# Patient Record
Sex: Male | Born: 1985 | Race: Black or African American | Hispanic: No | Marital: Married
Health system: Southern US, Community
[De-identification: ages and names within clinical notes are randomized; demographics above are authoritative.]

---

## 2018-02-03 DIAGNOSIS — F418 Other specified anxiety disorders: Secondary | ICD-10-CM | POA: Diagnosis not present

## 2018-02-28 DIAGNOSIS — H0012 Chalazion right lower eyelid: Secondary | ICD-10-CM | POA: Diagnosis not present

## 2019-07-04 ENCOUNTER — Emergency Department (HOSPITAL_COMMUNITY)
Admission: EM | Admit: 2019-07-04 | Discharge: 2019-07-04 | Disposition: A | Discharge status: Home / Self Care | Payer: BC Managed Care – PPO | Attending: Emergency Medicine | Admitting: Emergency Medicine

## 2019-07-04 ENCOUNTER — Emergency Department (HOSPITAL_COMMUNITY): Payer: BC Managed Care – PPO

## 2019-07-04 ENCOUNTER — Other Ambulatory Visit: Payer: Self-pay

## 2019-07-04 DIAGNOSIS — R197 Diarrhea, unspecified: Secondary | ICD-10-CM | POA: Insufficient documentation

## 2019-07-04 DIAGNOSIS — B349 Viral infection, unspecified: Secondary | ICD-10-CM | POA: Insufficient documentation

## 2019-07-04 DIAGNOSIS — Z20828 Contact with and (suspected) exposure to other viral communicable diseases: Secondary | ICD-10-CM | POA: Insufficient documentation

## 2019-07-04 DIAGNOSIS — R509 Fever, unspecified: Secondary | ICD-10-CM | POA: Diagnosis not present

## 2019-07-04 LAB — CBC WITH DIFFERENTIAL/PLATELET
Abs Immature Granulocytes: 0.01 10*3/uL (ref 0.00–0.07)
Basophils Absolute: 0 10*3/uL (ref 0.0–0.1)
Basophils Relative: 1 %
Eosinophils Absolute: 0 10*3/uL (ref 0.0–0.5)
Eosinophils Relative: 0 %
HCT: 46.4 % (ref 39.0–52.0)
Hemoglobin: 15.7 g/dL (ref 13.0–17.0)
Immature Granulocytes: 0 %
Lymphocytes Relative: 12 %
Lymphs Abs: 1 10*3/uL (ref 0.7–4.0)
MCH: 27 pg (ref 26.0–34.0)
MCHC: 33.8 g/dL (ref 30.0–36.0)
MCV: 79.9 fL — ABNORMAL LOW (ref 80.0–100.0)
Monocytes Absolute: 0.7 10*3/uL (ref 0.1–1.0)
Monocytes Relative: 8 %
Neutro Abs: 6.6 10*3/uL (ref 1.7–7.7)
Neutrophils Relative %: 79 %
Platelets: 161 10*3/uL (ref 150–400)
RBC: 5.81 MIL/uL (ref 4.22–5.81)
RDW: 13 % (ref 11.5–15.5)
WBC: 8.3 10*3/uL (ref 4.0–10.5)
nRBC: 0 % (ref 0.0–0.2)

## 2019-07-04 LAB — COMPREHENSIVE METABOLIC PANEL
ALT: 41 U/L (ref 0–44)
AST: 27 U/L (ref 15–41)
Albumin: 4.3 g/dL (ref 3.5–5.0)
Alkaline Phosphatase: 61 U/L (ref 38–126)
Anion gap: 6 (ref 5–15)
BUN: 12 mg/dL (ref 6–20)
CO2: 27 mmol/L (ref 22–32)
Calcium: 9.3 mg/dL (ref 8.9–10.3)
Chloride: 106 mmol/L (ref 98–111)
Creatinine, Ser: 1.28 mg/dL — ABNORMAL HIGH (ref 0.61–1.24)
GFR calc Af Amer: >60 mL/min (ref >60)
GFR calc non Af Amer: >60 mL/min (ref >60)
Glucose, Bld: 79 mg/dL (ref 70–99)
Potassium: 4.4 mmol/L (ref 3.5–5.1)
Sodium: 139 mmol/L (ref 135–145)
Total Bilirubin: 0.4 mg/dL (ref 0.3–1.2)
Total Protein: 7.6 g/dL (ref 6.5–8.1)

## 2019-07-04 MED ORDER — SODIUM CHLORIDE 0.9 % IV BOLUS: 1000.0000 mL | Freq: Once | INTRAVENOUS | Status: DC

## 2019-07-04 MED ORDER — KETOROLAC TROMETHAMINE 30 MG/ML IJ SOLN
30.0000 mg | Freq: Once | INTRAMUSCULAR | Status: DC
  Filled 2019-07-04: qty 1

## 2019-07-04 MED ORDER — ONDANSETRON HCL 4 MG/2ML IJ SOLN
4.0000 mg | Freq: Once | INTRAMUSCULAR | Status: DC
  Filled 2019-07-04: qty 2

## 2019-07-04 NOTE — ED Provider Notes (Signed)
[facility]-EMERGENCY DEPT Provider Note   CSN: [id number] Arrival date & time: 07/04/19  1113     History   Chief Complaint Chief Complaint  Patient presents with  . Fever  . Diarrhea    HPI Steven Blackburn is a 33 y.o. male who presents for evaluation of generalized body aches, low-grade fever, diarrhea, headache that began yesterday.  He started noticing at home, he had a fever of 100.8.  He states he has been taking Tylenol and ibuprofen.  He states that last night and this morning, he had several episodes of nonbloody diarrhea.  He states he is just felt achy and sore.  He reports that today, he started developing a mild headache that gradually worsened.  He also reports a cough that is productive of green sputum.  No blood.  He states he is not having any difficulty breathing or chest pain.  Patient states he has not had any recent travel.  He does report that he goes to [employer] and recently was at a car dealership but states he was wearing mask the entire time.  He does not have any known COVID-19 exposure.  He denies any vision changes, numbness/weakness of his arms or legs, nausea/vomiting.     The history is provided by the patient.    No past medical history on file.  There are no active problems to display for this patient.    The histories are not reviewed yet. Please review them in the "History" navigator section and refresh this SmartLink.      Home Medications    Prior to Admission medications   Medication Sig Start Date End Date Taking? Authorizing Provider  acetaminophen (TYLENOL) 325 MG tablet Take 650 mg by mouth every 6 (six) hours as needed for mild pain, fever or headache.   Yes [provider]    Family History No family history on file.  Social History Social History   Tobacco Use  . Smoking status: Not on file  Substance Use Topics  . Alcohol use: Not on file  . Drug use: Not on file     Allergies   Patient  has no known allergies.   Review of Systems Review of Systems  Constitutional: Positive for fever.  Respiratory: Negative for cough and shortness of breath.   Cardiovascular: Negative for chest pain.  Gastrointestinal: Positive for diarrhea. Negative for abdominal pain, nausea and vomiting.  Genitourinary: Negative for dysuria and hematuria.  Musculoskeletal: Positive for myalgias.  Neurological: Positive for headaches.  All other systems reviewed and are negative.    Physical Exam Updated Vital Signs BP 117/74   Pulse 85   Temp 98.8 F (37.1 C)   Resp 18   SpO2 100%   Physical Exam Vitals signs and nursing note reviewed.  Constitutional:      Appearance: Normal appearance. He is well-developed.  HENT:     Head: Normocephalic and atraumatic.  Eyes:     General: Lids are normal.     Conjunctiva/sclera: Conjunctivae normal.     Pupils: Pupils are equal, round, and reactive to light.     Comments: PERRL. EOMs intact. No nystagmus. No neglect.   Neck:     Musculoskeletal: Full passive range of motion without pain and neck supple.     Comments: Neck is supple and without rigidity. Cardiovascular:     Rate and Rhythm: Normal rate and regular rhythm.     Pulses: Normal pulses.     Heart sounds:  Normal heart sounds. No murmur. No friction rub. No gallop.   Pulmonary:     Effort: Pulmonary effort is normal.     Breath sounds: Normal breath sounds.     Comments: Lungs clear to auscultation bilaterally.  Symmetric chest rise.  No wheezing, rales, rhonchi. Abdominal:     Palpations: Abdomen is soft. Abdomen is not rigid.     Tenderness: There is no abdominal tenderness. There is no guarding.     Comments: Abdomen is soft, non-distended, non-tender. No rigidity, No guarding. No peritoneal signs.  Musculoskeletal: Normal range of motion.  Skin:    General: Skin is warm and dry.     Capillary Refill: Capillary refill takes less than 2 seconds.  Neurological:     Mental  Status: He is alert and oriented to person, place, and time.     Comments: Follows commands, Moves all extremities  5/5 strength to BUE and BLE  Sensation intact throughout all major nerve distributions  Psychiatric:        Speech: Speech normal.      ED Treatments / Results  Labs (all labs ordered are listed, but only abnormal results are displayed) Labs Reviewed  COMPREHENSIVE METABOLIC PANEL - Abnormal; Notable for the following components:      Result Value   Creatinine, Ser 1.28 (*)    All other components within normal limits  CBC WITH DIFFERENTIAL/PLATELET - Abnormal; Notable for the following components:   MCV 79.9 (*)    All other components within normal limits  NOVEL CORONAVIRUS, NAA (HOSP ORDER, SEND-OUT TO REF LAB; TAT 18-24 HRS)    EKG None  Radiology Dg Chest Portable 1 View  Result Date: 07/04/2019 CLINICAL DATA:  Pt states that he has had fever 100.8 at home, along with diarrhea. Pt states that he has had a headache and lightheadedness as well. Pt states some relief with tylenol. EXAM: PORTABLE CHEST 1 VIEW COMPARISON:  None. FINDINGS: The heart size and mediastinal contours are within normal limits. Both lungs are clear. No pleural effusion or pneumothorax. The visualized skeletal structures are unremarkable. IMPRESSION: Normal frontal chest radiograph. Electronically Signed   By: Lajean Manes M.D.   On: 07/04/2019 13:12    Procedures Procedures (including critical care time)  Medications Ordered in ED Medications - No data to display   Initial Impression / Assessment and Plan / ED Course  I have reviewed the triage vital signs and the nursing notes.  Pertinent labs & imaging results that were available during my care of the patient were reviewed by me and considered in my medical decision making (see chart for details).        33 year old male who presents for evaluation of generalized body aches, diarrhea, low-grade fever, cough that began  yesterday.  No recent travel.  No known COVID-19 exposure.  Initially arrival, he is afebrile, nontoxic-appearing.  Vitals appear stable.  Concern for dehydration versus viral infectious process.  History/physical exam is not concerning for meningitis.  Additionally, do not suspect intracranial abnormality.  Suspect this is consolation of symptoms from viral process.  Additionally, his abdomen exam is benign.  Do not feel is a surgical abdomen.  Indication for CT abdomen pelvis.  Plan for labs, chest x-ray.  CBC shows no acute leukocytosis or anemia.  CMP is unremarkable.  Discussed results with patient.  Patient is sitting comfortably in bed.  Vitals are stable.  Discussed with patient that he has a Covid test pending.  At this time, patient  stable for discharge. At this time, patient exhibits no emergent life-threatening condition that require further evaluation in ED or admission. Patient had ample opportunity for questions and discussion. All patient's questions were answered with full understanding. Strict return precautions discussed. Patient expresses understanding and agreement to plan.   Louis Ivery was evaluated in Emergency Department on 07/04/2019 for the symptoms described in the history of present illness. He was evaluated in the context of the global COVID-19 pandemic, which necessitated consideration that the patient might be at risk for infection with the SARS-CoV-2 virus that causes COVID-19. Institutional protocols and algorithms that pertain to the evaluation of patients at risk for COVID-19 are in a state of rapid change based on information released by regulatory bodies including the CDC and federal and state organizations. These policies and algorithms were followed during the patient's care in the ED.  Portions of this note were generated with Scientist, clinical (histocompatibility and immunogenetics). Dictation errors may occur despite best attempts at proofreading.      Final Clinical Impressions(s) / ED  Diagnoses   Final diagnoses:  Viral illness  Diarrhea, unspecified type    ED Discharge Orders    None       Maxwell Caul, PA-C 07/04/19 1737    Pricilla Loveless, MD 07/05/19 1020

## 2019-07-04 NOTE — ED Triage Notes (Addendum)
Pt states that he has had fever 100.8 at home, along with diarrhea. Pt states that he has had a headache as well. Pt states some relief with tylenol. Pt states some lightheadedness as well.

## 2019-07-04 NOTE — Discharge Instructions (Addendum)
You have been tested today for COVID-19.  Results will take about 24 hours to come back.  You can follow-up online regarding the status of your tests.  You should quarantine until results come back.  Make sure you are staying hydrated and drinking plenty of fluids.  You can take Tylenol or Ibuprofen as directed for pain. You can alternate Tylenol and Ibuprofen every 4 hours. If you take Tylenol at 1pm, then you can take Ibuprofen at 5pm. Then you can take Tylenol again at 9pm.   Return the emergency department for any trouble breathing, inability eat or drink or any other worsening or concerning symptoms.     Person Under Monitoring Name: Steven Blackburn  [place] [id number] [place] [id number]   Infection Prevention Recommendations for Individuals Confirmed to have, or Being Evaluated for, 2019 Novel Coronavirus (COVID-19) Infection Who Receive Care at Home  Individuals who are confirmed to have, or are being evaluated for, COVID-19 should follow the prevention steps below until a healthcare provider or local or state health department says they can return to normal activities.  Stay home except to get medical care You should restrict activities outside your home, except for getting medical care. Do not go to work, school, or public areas, and do not use public transportation or taxis.  Call ahead before visiting your doctor Before your medical appointment, call the healthcare provider and tell them that you have, or are being evaluated for, COVID-19 infection. This will help the healthcare providers office take steps to keep other people from getting infected. Ask your healthcare provider to call the local or state health department.  Monitor your symptoms Seek prompt medical attention if your illness is worsening (e.g., difficulty breathing). Before going to your medical appointment, call the healthcare provider and tell them that you have, or are being evaluated for,  COVID-19 infection. Ask your healthcare provider to call the local or state health department.  Wear a facemask You should wear a facemask that covers your nose and mouth when you are in the same room with other people and when you visit a healthcare provider. People who live with or visit you should also wear a facemask while they are in the same room with you.  Separate yourself from other people in your home As much as possible, you should stay in a different room from other people in your home. Also, you should use a separate bathroom, if available.  Avoid sharing household items You should not share dishes, drinking glasses, cups, eating utensils, towels, bedding, or other items with other people in your home. After using these items, you should wash them thoroughly with soap and water.  Cover your coughs and sneezes Cover your mouth and nose with a tissue when you cough or sneeze, or you can cough or sneeze into your sleeve. Throw used tissues in a lined trash can, and immediately wash your hands with soap and water for at least 20 seconds or use an alcohol-based hand rub.  Wash your Union Pacific Corporationhands Wash your hands often and thoroughly with soap and water for at least 20 seconds. You can use an alcohol-based hand sanitizer if soap and water are not available and if your hands are not visibly dirty. Avoid touching your eyes, nose, and mouth with unwashed hands.   Prevention Steps for Caregivers and Household Members of Individuals Confirmed to have, or Being Evaluated for, COVID-19 Infection Being Cared for in the Home  If you live with, or  provide care at home for, a person confirmed to have, or being evaluated for, COVID-19 infection please follow these guidelines to prevent infection:  Follow healthcare providers instructions Make sure that you understand and can help the patient follow any healthcare provider instructions for all care.  Provide for the patients basic needs You  should help the patient with basic needs in the home and provide support for getting groceries, prescriptions, and other personal needs.  Monitor the patients symptoms If they are getting sicker, call his or her medical provider and tell them that the patient has, or is being evaluated for, COVID-19 infection. This will help the healthcare providers office take steps to keep other people from getting infected. Ask the healthcare provider to call the local or state health department.  Limit the number of people who have contact with the patient If possible, have only one caregiver for the patient. Other household members should stay in another home or place of residence. If this is not possible, they should stay in another room, or be separated from the patient as much as possible. Use a separate bathroom, if available. Restrict visitors who do not have an essential need to be in the home.  Keep older adults, very young children, and other sick people away from the patient Keep older adults, very young children, and those who have compromised immune systems or chronic health conditions away from the patient. This includes people with chronic heart, lung, or kidney conditions, diabetes, and cancer.  Ensure good ventilation Make sure that shared spaces in the home have good air flow, such as from an air conditioner or an opened window, weather permitting.  Wash your hands often Wash your hands often and thoroughly with soap and water for at least 20 seconds. You can use an alcohol based hand sanitizer if soap and water are not available and if your hands are not visibly dirty. Avoid touching your eyes, nose, and mouth with unwashed hands. Use disposable paper towels to dry your hands. If not available, use dedicated cloth towels and replace them when they become wet.  Wear a facemask and gloves Wear a disposable facemask at all times in the room and gloves when you touch or have contact  with the patients blood, body fluids, and/or secretions or excretions, such as sweat, saliva, sputum, nasal mucus, vomit, urine, or feces.  Ensure the mask fits over your nose and mouth tightly, and do not touch it during use. Throw out disposable facemasks and gloves after using them. Do not reuse. Wash your hands immediately after removing your facemask and gloves. If your personal clothing becomes contaminated, carefully remove clothing and launder. Wash your hands after handling contaminated clothing. Place all used disposable facemasks, gloves, and other waste in a lined container before disposing them with other household waste. Remove gloves and wash your hands immediately after handling these items.  Do not share dishes, glasses, or other household items with the patient Avoid sharing household items. You should not share dishes, drinking glasses, cups, eating utensils, towels, bedding, or other items with a patient who is confirmed to have, or being evaluated for, COVID-19 infection. After the person uses these items, you should wash them thoroughly with soap and water.  Wash laundry thoroughly Immediately remove and wash clothes or bedding that have blood, body fluids, and/or secretions or excretions, such as sweat, saliva, sputum, nasal mucus, vomit, urine, or feces, on them. Wear gloves when handling laundry from the patient. Read and follow  directions on labels of laundry or clothing items and detergent. In general, wash and dry with the warmest temperatures recommended on the label.  Clean all areas the individual has used often Clean all touchable surfaces, such as counters, tabletops, doorknobs, bathroom fixtures, toilets, phones, keyboards, tablets, and bedside tables, every day. Also, clean any surfaces that may have blood, body fluids, and/or secretions or excretions on them. Wear gloves when cleaning surfaces the patient has come in contact with. Use a diluted bleach solution  (e.g., dilute bleach with 1 part bleach and 10 parts water) or a household disinfectant with a label that says EPA-registered for coronaviruses. To make a bleach solution at home, add 1 tablespoon of bleach to 1 quart (4 cups) of water. For a larger supply, add  cup of bleach to 1 gallon (16 cups) of water. Read labels of cleaning products and follow recommendations provided on product labels. Labels contain instructions for safe and effective use of the cleaning product including precautions you should take when applying the product, such as wearing gloves or eye protection and making sure you have good ventilation during use of the product. Remove gloves and wash hands immediately after cleaning.  Monitor yourself for signs and symptoms of illness Caregivers and household members are considered close contacts, should monitor their health, and will be asked to limit movement outside of the home to the extent possible. Follow the monitoring steps for close contacts listed on the symptom monitoring form.   ? If you have additional questions, contact your local health department or call the epidemiologist on call at [phone] (available 24/7). ? This guidance is subject to change. For the most up-to-date guidance from [facility], please refer to their website: [email or web address]

## 2019-07-04 NOTE — ED Notes (Signed)
Stuck patient once for IV and unable to get IV on first try. Pt refusing another attempt and stated that he would rather not have an IV anyway.

## 2019-07-05 LAB — NOVEL CORONAVIRUS, NAA (HOSP ORDER, SEND-OUT TO REF LAB; TAT 18-24 HRS): SARS-CoV-2, NAA: NOT DETECTED

## 2021-07-13 IMAGING — DX DG CHEST 1V PORT
1 series · 1 of 1 positions shown · non-contrast
Comparison: None.

CLINICAL DATA: Pt states that he has had fever 100.8 at home, along
with diarrhea. Pt states that he has had a headache and
lightheadedness as well. Pt states some relief with tylenol.

EXAM:
PORTABLE CHEST 1 VIEW

[chest ap]
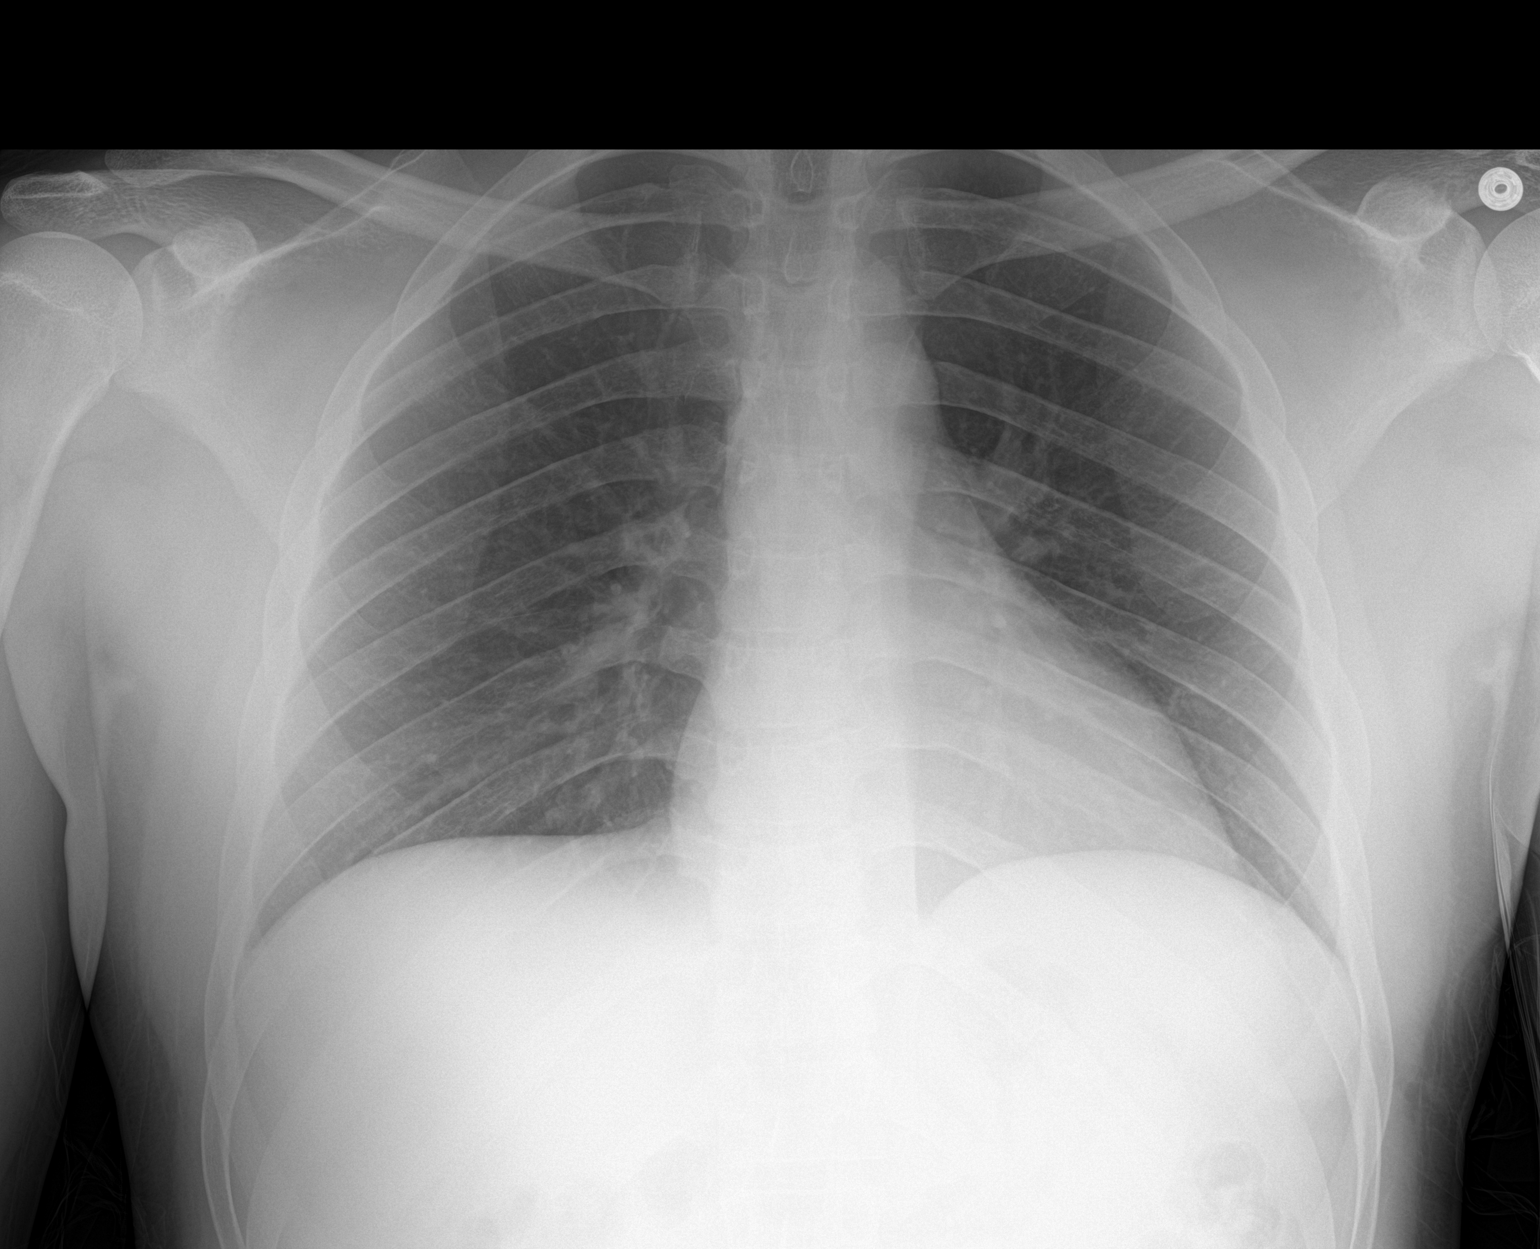

[1 of 1 positions shown; findings below may reference images not displayed]

FINDINGS: The heart size and mediastinal contours are within normal limits.
Both lungs are clear. No pleural effusion or pneumothorax. The
visualized skeletal structures are unremarkable.
IMPRESSION: Normal frontal chest radiograph.

## 2022-10-02 DIAGNOSIS — Z13220 Encounter for screening for lipoid disorders: Secondary | ICD-10-CM | POA: Diagnosis not present

## 2022-10-02 DIAGNOSIS — Z0000 Encounter for general adult medical examination without abnormal findings: Secondary | ICD-10-CM | POA: Diagnosis not present

## 2023-01-31 DIAGNOSIS — D72819 Decreased white blood cell count, unspecified: Secondary | ICD-10-CM | POA: Diagnosis not present

## 2023-02-05 DIAGNOSIS — J029 Acute pharyngitis, unspecified: Secondary | ICD-10-CM | POA: Diagnosis not present

## 2023-02-05 DIAGNOSIS — H6123 Impacted cerumen, bilateral: Secondary | ICD-10-CM | POA: Diagnosis not present

## 2023-03-04 DIAGNOSIS — M25561 Pain in right knee: Secondary | ICD-10-CM | POA: Diagnosis not present

## 2023-12-24 DIAGNOSIS — M2550 Pain in unspecified joint: Secondary | ICD-10-CM | POA: Diagnosis not present

## 2023-12-24 DIAGNOSIS — Z0000 Encounter for general adult medical examination without abnormal findings: Secondary | ICD-10-CM | POA: Diagnosis not present

## 2023-12-24 DIAGNOSIS — Z1331 Encounter for screening for depression: Secondary | ICD-10-CM | POA: Diagnosis not present

## 2024-03-08 DIAGNOSIS — H6123 Impacted cerumen, bilateral: Secondary | ICD-10-CM | POA: Diagnosis not present
